# Patient Record
Sex: Male | Born: 1965 | Race: White | Hispanic: No | Smoking: Current every day smoker
Health system: Southern US, Community
[De-identification: ages and names within clinical notes are randomized; demographics above are authoritative.]

---

## 2018-02-09 ENCOUNTER — Other Ambulatory Visit: Payer: Self-pay

## 2018-02-09 ENCOUNTER — Ambulatory Visit (INDEPENDENT_AMBULATORY_CARE_PROVIDER_SITE_OTHER): Payer: Self-pay

## 2018-02-09 ENCOUNTER — Ambulatory Visit
Admission: EM | Admit: 2018-02-09 | Discharge: 2018-02-09 | Disposition: A | Discharge status: Home / Self Care | Payer: Self-pay | Attending: Family Medicine | Admitting: Family Medicine

## 2018-02-09 DIAGNOSIS — R05 Cough: Secondary | ICD-10-CM

## 2018-02-09 DIAGNOSIS — R062 Wheezing: Secondary | ICD-10-CM | POA: Insufficient documentation

## 2018-02-09 DIAGNOSIS — Z79899 Other long term (current) drug therapy: Secondary | ICD-10-CM | POA: Insufficient documentation

## 2018-02-09 DIAGNOSIS — R5383 Other fatigue: Secondary | ICD-10-CM

## 2018-02-09 DIAGNOSIS — R079 Chest pain, unspecified: Secondary | ICD-10-CM | POA: Insufficient documentation

## 2018-02-09 DIAGNOSIS — J9801 Acute bronchospasm: Secondary | ICD-10-CM | POA: Insufficient documentation

## 2018-02-09 DIAGNOSIS — R0789 Other chest pain: Secondary | ICD-10-CM

## 2018-02-09 DIAGNOSIS — F17210 Nicotine dependence, cigarettes, uncomplicated: Secondary | ICD-10-CM | POA: Insufficient documentation

## 2018-02-09 MED ORDER — IPRATROPIUM-ALBUTEROL 0.5-2.5 (3) MG/3ML IN SOLN
3.0000 mL | Freq: Once | RESPIRATORY_TRACT | Status: AC
  Administered 2018-02-09: 3 mL via RESPIRATORY_TRACT

## 2018-02-09 MED ORDER — ALBUTEROL SULFATE HFA 108 (90 BASE) MCG/ACT IN AERS: 1.0000 | INHALATION_SPRAY | Freq: Four times a day (QID) | RESPIRATORY_TRACT | 0 refills | Status: AC | PRN

## 2018-02-09 NOTE — ED Provider Notes (Signed)
[facility]    CSN: [id number] Arrival date & time: 02/09/18  1002     History   Chief Complaint Chief Complaint  Patient presents with  . Chest Pain    HPI Kevin Bowen is a 52 y.o. male.   The history is provided by the patient.  Chest Pain  Pain location:  Substernal area Pain radiates to:  Does not radiate Pain severity:  Moderate Onset quality:  Sudden Duration:  10 minutes Timing:  Constant Progression:  Resolved Chronicity:  New Context: at rest   Relieved by:  None tried Worsened by:  Nothing Ineffective treatments:  None tried Associated symptoms: cough, fatigue and fever   Associated symptoms: no abdominal pain, no AICD problem, no altered mental status, no anorexia, no anxiety, no back pain, no claudication, no diaphoresis, no dizziness, no dysphagia, no headache, no heartburn, no lower extremity edema, no nausea, no near-syncope, no numbness, no orthopnea, no palpitations, no PND, no shortness of breath, no syncope, no vomiting and no weakness   Associated symptoms comment:  Wheezing Risk factors: male sex and smoking   Risk factors: no aortic disease, no birth control, no coronary artery disease, no diabetes mellitus, no Ehlers-Danlos syndrome, no high cholesterol, no hypertension, no immobilization, no Marfan's syndrome, not obese, not pregnant, no prior DVT/PE and no surgery     History reviewed. No pertinent past medical history.  There are no active problems to display for this patient.   History reviewed. No pertinent surgical history.     Home Medications    Prior to Admission medications   Medication Sig Start Date End Date Taking? Authorizing Provider  albuterol (PROVENTIL HFA;VENTOLIN HFA) 108 (90 Base) MCG/ACT inhaler Inhale 1-2 puffs into the lungs every 6 (six) hours as needed for wheezing or shortness of breath. 02/09/18   Payton Mccallum, MD    Family History Family History  Problem Relation Age of Onset  . Heart disease  Mother   . COPD Father     Social History Social History   Tobacco Use  . Smoking status: Current Every Day Smoker    Packs/day: 1.50    Types: Cigarettes  . Smokeless tobacco: Never Used  Substance Use Topics  . Alcohol use: Yes    Comment: 12 pack per week  . Drug use: Yes    Types: Marijuana    Comment: last use a week ago     Allergies   Patient has no known allergies.   Review of Systems Review of Systems  Constitutional: Positive for fatigue and fever. Negative for diaphoresis.  HENT: Negative for trouble swallowing.   Respiratory: Positive for cough. Negative for shortness of breath.   Cardiovascular: Positive for chest pain. Negative for palpitations, orthopnea, claudication, syncope, PND and near-syncope.  Gastrointestinal: Negative for abdominal pain, anorexia, heartburn, nausea and vomiting.  Musculoskeletal: Negative for back pain.  Neurological: Negative for dizziness, weakness, numbness and headaches.     Physical Exam Triage Vital Signs ED Triage Vitals  Enc Vitals Group     BP 02/09/18 1014 (!) 167/84     Pulse Rate 02/09/18 1014 61     Resp 02/09/18 1014 20     Temp 02/09/18 1014 98.1 F (36.7 C)     Temp Source 02/09/18 1014 Oral     SpO2 02/09/18 1014 99 %     Weight 02/09/18 1009 205 lb (93 kg)     Height 02/09/18 1009  (1.753 m)     Head  Circumference --      Peak Flow --      Pain Score 02/09/18 1009 0     Pain Loc --      Pain Edu? --      Excl. in GC? --    No data found.  Updated Vital Signs BP (!) 148/80 (BP Location: Right Arm)   Pulse 61   Temp 98.1 F (36.7 C) (Oral)   Resp 20   Ht  (1.753 m)   Wt 205 lb (93 kg)   SpO2 99%   BMI 30.27 kg/m   Visual Acuity Right Eye Distance:   Left Eye Distance:   Bilateral Distance:    Right Eye Near:   Left Eye Near:    Bilateral Near:     Physical Exam  Constitutional: He appears well-developed and well-nourished. No distress.  HENT:  Head: Normocephalic and  atraumatic.  Right Ear: Tympanic membrane, external ear and ear canal normal.  Left Ear: Tympanic membrane, external ear and ear canal normal.  Nose: Nose normal.  Mouth/Throat: Uvula is midline, oropharynx is clear and moist and mucous membranes are normal. No oropharyngeal exudate or tonsillar abscesses.  Neck: Normal range of motion. Neck supple. No tracheal deviation present. No thyromegaly present.  Cardiovascular: Normal rate, regular rhythm and normal heart sounds.  Pulmonary/Chest: Effort normal. No stridor. No respiratory distress. He has wheezes. He has no rales. He exhibits no tenderness.  Abdominal: Soft. Bowel sounds are normal. He exhibits no distension. There is no tenderness.  Lymphadenopathy:    He has no cervical adenopathy.  Neurological: He is alert.  Skin: Skin is warm and dry. No rash noted. He is not diaphoretic.  Nursing note and vitals reviewed.    UC Treatments / Results  Labs (all labs ordered are listed, but only abnormal results are displayed) Labs Reviewed - No data to display  EKG None  Radiology Dg Chest 2 View  Result Date: 02/09/2018 CLINICAL DATA:  Cough, chest pain EXAM: CHEST - 2 VIEW COMPARISON:  None. FINDINGS: The heart size and mediastinal contours are within normal limits. Both lungs are clear. The visualized skeletal structures are unremarkable. IMPRESSION: No active cardiopulmonary disease. Electronically Signed   By: Charlett Nose M.D.   On: 02/09/2018 11:03    Procedures ED EKG Date/Time: 02/09/2018 12:28 PM Performed by: Payton Mccallum, MD Authorized by: Payton Mccallum, MD   ECG reviewed by ED Physician in the absence of a cardiologist: yes   Previous ECG:    Previous ECG:  Unavailable Interpretation:    Interpretation: normal   Rate:    ECG rate assessment: normal   Rhythm:    Rhythm: sinus rhythm   Ectopy:    Ectopy: none   QRS:    QRS axis:  Normal Conduction:    Conduction: normal   ST segments:    ST segments:   Normal T waves:    T waves: non-specific     (including critical care time)  Medications Ordered in UC Medications  ipratropium-albuterol (DUONEB) 0.5-2.5 (3) MG/3ML nebulizer solution 3 mL (3 mLs Nebulization Given 02/09/18 1031)    Initial Impression / Assessment and Plan / UC Course  I have reviewed the triage vital signs and the nursing notes.  Pertinent labs & imaging results that were available during my care of the patient were reviewed by me and considered in my medical decision making (see chart for details).      Final Clinical Impressions(s) / UC Diagnoses  Final diagnoses:  Nonspecific chest pain  Wheezing  Bronchospasm     Discharge Instructions     Recommend patient establish care with PCP     ED Prescriptions    Medication Sig Dispense Auth. Provider   albuterol (PROVENTIL HFA;VENTOLIN HFA) 108 (90 Base) MCG/ACT inhaler Inhale 1-2 puffs into the lungs every 6 (six) hours as needed for wheezing or shortness of breath. 1 Inhaler Payton Mccallum, MD      1. ekg/x-ray results and diagnosis reviewed with patient; given duoneb x 1 with improvement/resolution of wheezing 2. rx as per orders above; reviewed possible side effects, interactions, risks and benefits  3. Recommend establish care with PCP; go to ED if symptoms recur and worsen 4. Follow-up prn if symptoms worsen or don't improve    Controlled Substance Prescriptions [place] Controlled Substance Registry consulted? Not Applicable   Payton Mccallum, MD 02/09/18 [phone]

## 2018-02-09 NOTE — Discharge Instructions (Addendum)
Recommend patient establish care with PCP

## 2018-02-09 NOTE — ED Triage Notes (Signed)
Pt reports he was going about his morning routine about an hour ago and he started to feel squeezing in the center of his chest that lasted x 10 minutes. Has been sick recently with "hot and cold sweats" and a cough. Denies nausea, dizziness, or radiation. No Chest pain currently.

## 2018-10-19 IMAGING — CR DG CHEST 2V
2 series · 3 of 3 positions shown · non-contrast
Comparison: None.

CLINICAL DATA: Cough, chest pain

EXAM:
CHEST - 2 VIEW

[chest pa]
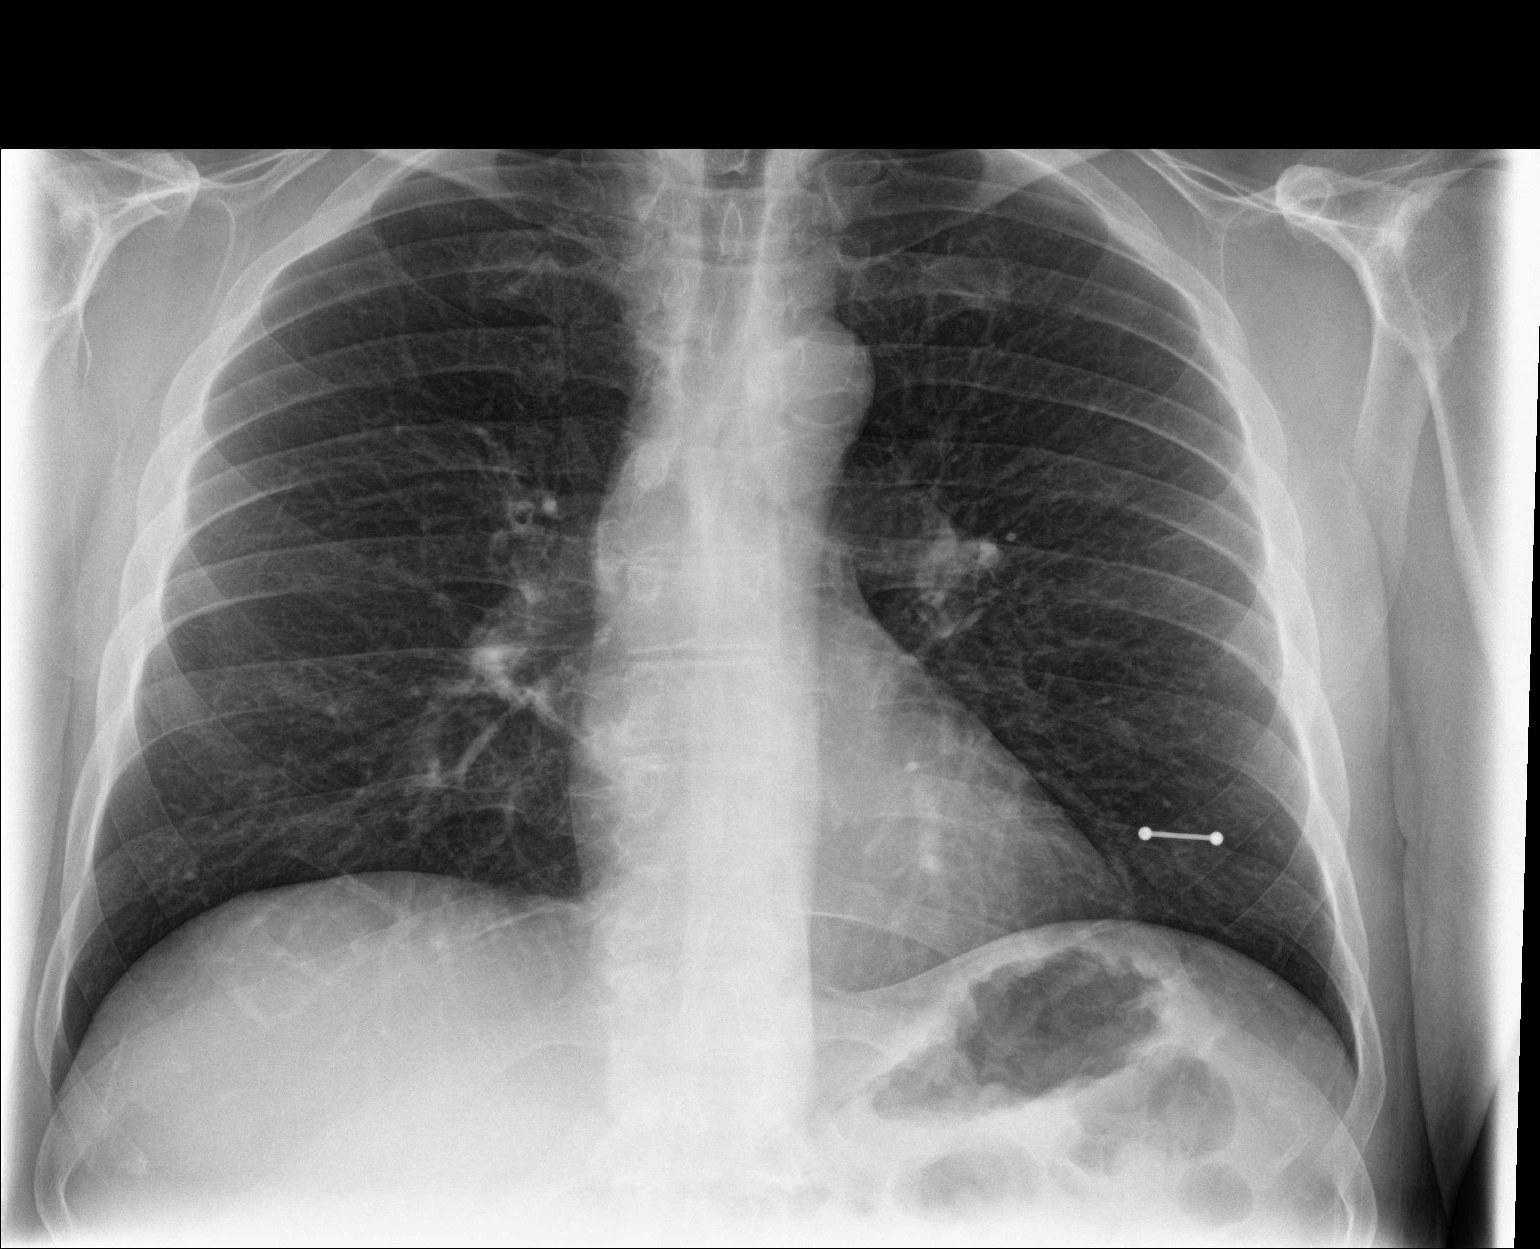

[Series 2: chest lat · 0.14mm/px · 2 of 2 slices shown]
[im 1/2]
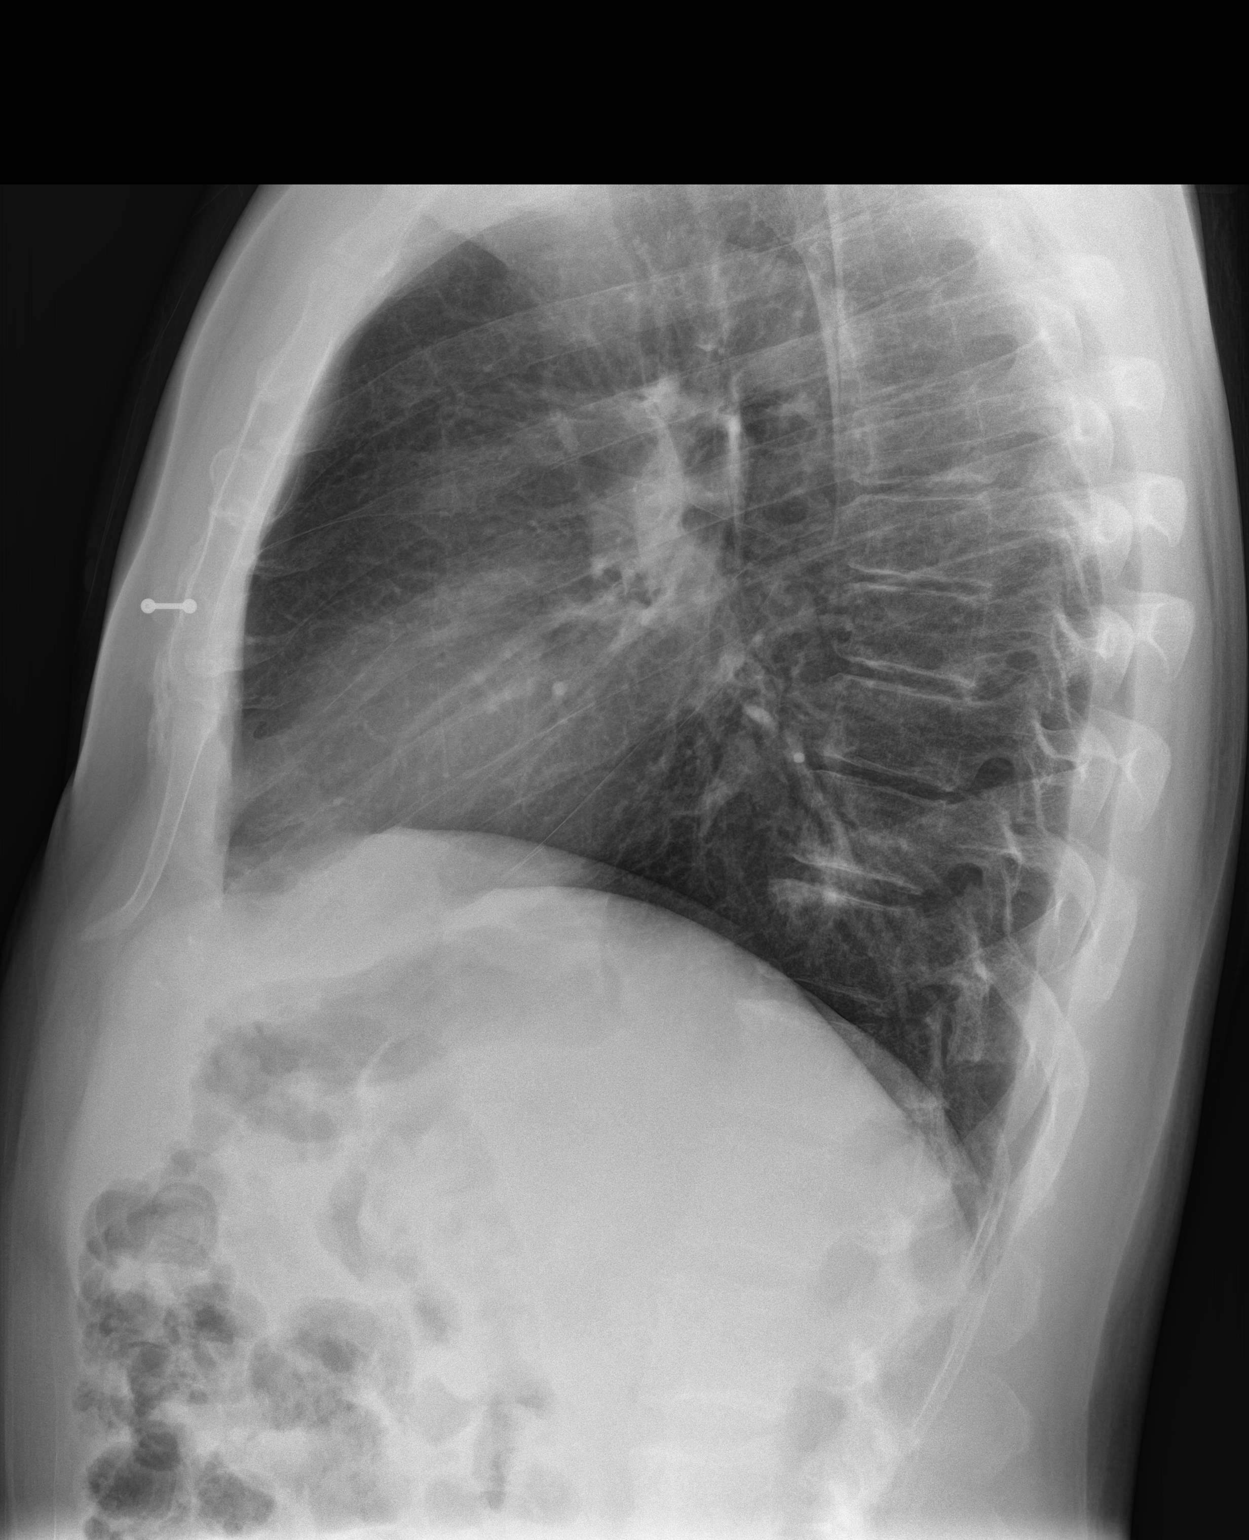
[im 2/2]
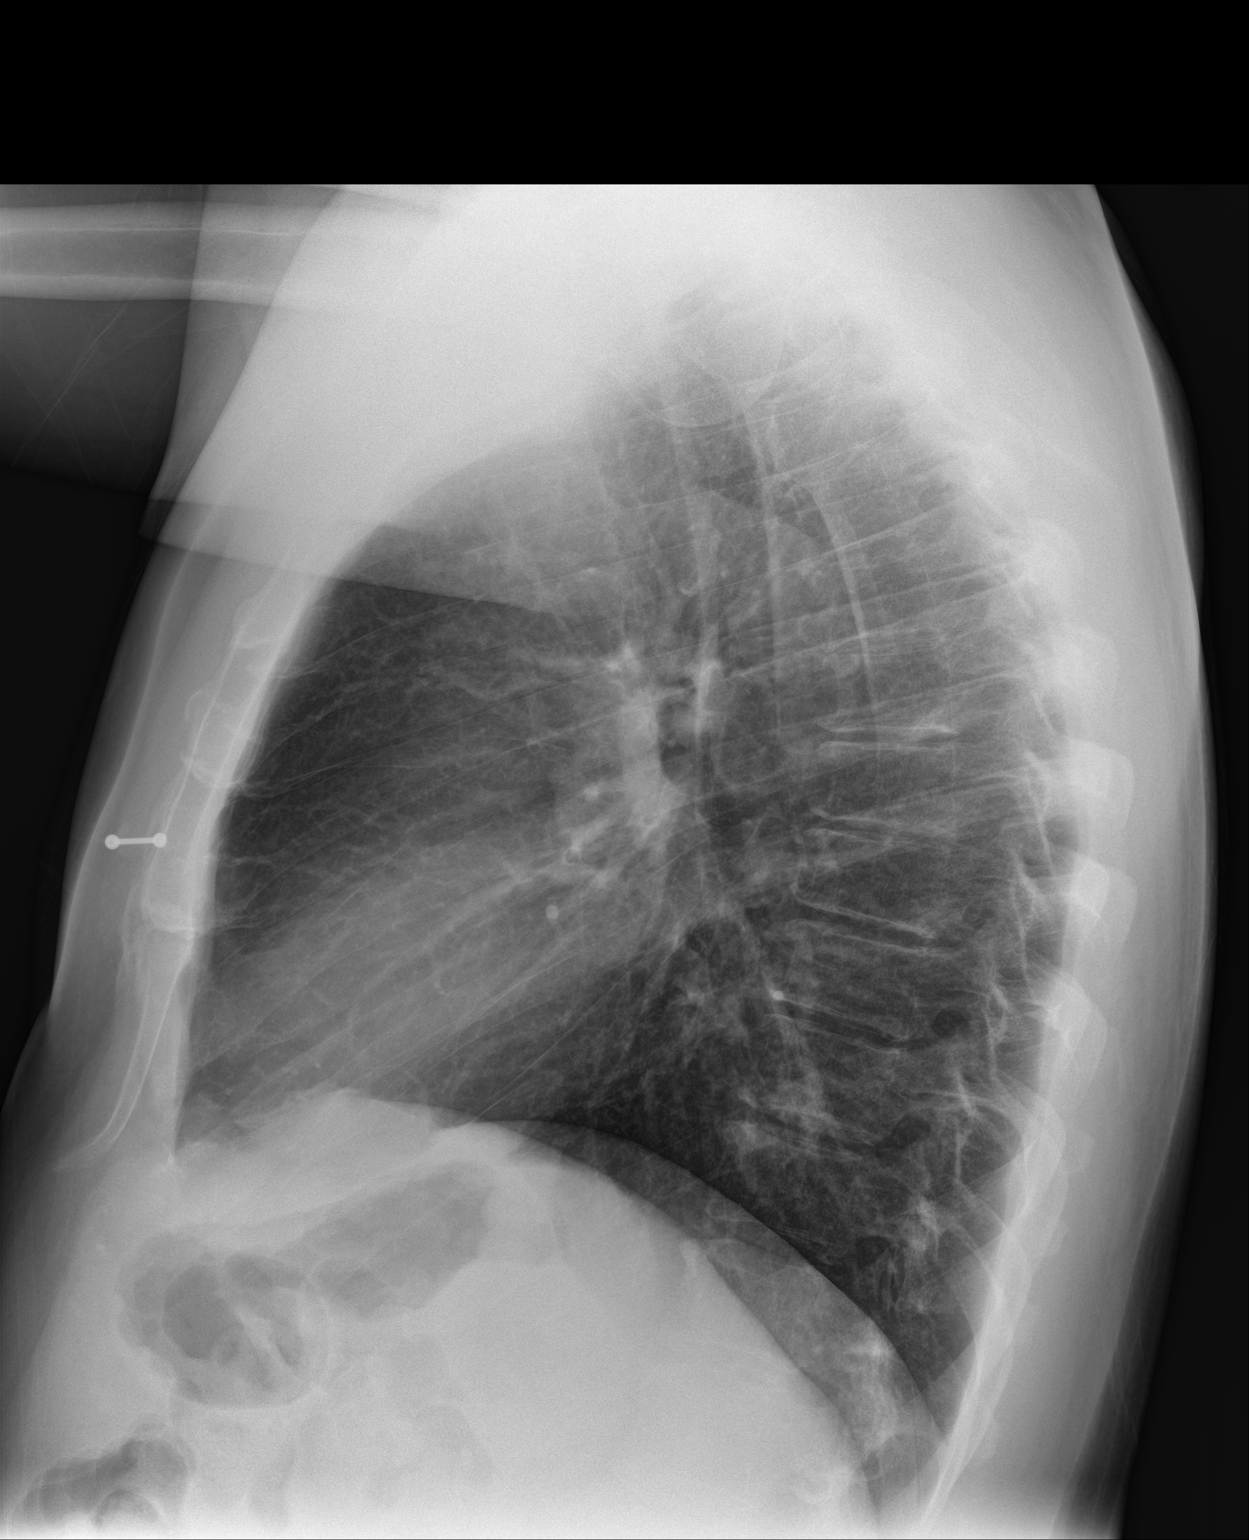

[3 of 3 positions shown; findings below may reference images not displayed]

FINDINGS: The heart size and mediastinal contours are within normal limits.
Both lungs are clear. The visualized skeletal structures are
unremarkable.
IMPRESSION: No active cardiopulmonary disease.
